# Patient Record
Sex: Female | Born: 1935 | Race: White | Hispanic: No | Marital: Married
Health system: Southern US, Community
[De-identification: ages and names within clinical notes are randomized; demographics above are authoritative.]

---

## 2008-01-19 ENCOUNTER — Ambulatory Visit: Payer: Self-pay | Admitting: Family Medicine

## 2008-02-19 ENCOUNTER — Ambulatory Visit: Payer: Self-pay | Admitting: General Surgery

## 2008-02-19 ENCOUNTER — Other Ambulatory Visit: Payer: Self-pay

## 2008-02-26 ENCOUNTER — Ambulatory Visit: Payer: Self-pay | Admitting: General Surgery

## 2008-03-12 ENCOUNTER — Other Ambulatory Visit: Payer: Self-pay

## 2008-03-12 ENCOUNTER — Inpatient Hospital Stay: Payer: Self-pay | Admitting: Surgery

## 2008-03-13 ENCOUNTER — Other Ambulatory Visit: Payer: Self-pay

## 2011-06-25 ENCOUNTER — Ambulatory Visit: Payer: Self-pay | Admitting: Family Medicine

## 2011-11-18 ENCOUNTER — Ambulatory Visit: Payer: Self-pay | Admitting: Family Medicine

## 2012-05-06 ENCOUNTER — Inpatient Hospital Stay: Payer: Self-pay | Admitting: Internal Medicine

## 2012-05-06 LAB — COMPREHENSIVE METABOLIC PANEL
Alkaline Phosphatase: 92 U/L (ref 50–136)
Anion Gap: 9 (ref 7–16)
Bilirubin,Total: 0.4 mg/dL (ref 0.2–1.0)
Calcium, Total: 9 mg/dL (ref 8.5–10.1)
Chloride: 100 mmol/L (ref 98–107)
Co2: 28 mmol/L (ref 21–32)
Creatinine: 0.65 mg/dL (ref 0.60–1.30)
EGFR (African American): >60
EGFR (Non-African Amer.): >60
Osmolality: 280 (ref 275–301)
Potassium: 4 mmol/L (ref 3.5–5.1)
SGOT(AST): 68 U/L — ABNORMAL HIGH (ref 15–37)
SGPT (ALT): 23 U/L (ref 12–78)
Sodium: 137 mmol/L (ref 136–145)

## 2012-05-06 LAB — CBC WITH DIFFERENTIAL/PLATELET
Eosinophil %: 0 %
HCT: 40.7 % (ref 35.0–47.0)
HGB: 13.8 g/dL (ref 12.0–16.0)
Lymphocyte #: 0.5 10*3/uL — ABNORMAL LOW (ref 1.0–3.6)
MCH: 33.9 pg (ref 26.0–34.0)
MCV: 100 fL (ref 80–100)
Monocyte %: 5.4 %
Neutrophil #: 10.1 10*3/uL — ABNORMAL HIGH (ref 1.4–6.5)
RBC: 4.07 10*6/uL (ref 3.80–5.20)
RDW: 12.7 % (ref 11.5–14.5)
WBC: 11.2 10*3/uL — ABNORMAL HIGH (ref 3.6–11.0)

## 2012-05-06 LAB — CK: CK, Total: 221 U/L — ABNORMAL HIGH (ref 21–215)

## 2012-05-06 LAB — URINALYSIS, COMPLETE
Bacteria: NONE SEEN
Leukocyte Esterase: NEGATIVE
Ph: 7 (ref 4.5–8.0)
Protein: NEGATIVE
Squamous Epithelial: <1

## 2012-05-07 DIAGNOSIS — I6789 Other cerebrovascular disease: Secondary | ICD-10-CM

## 2012-05-07 LAB — BASIC METABOLIC PANEL
Anion Gap: 10 (ref 7–16)
Calcium, Total: 8.7 mg/dL (ref 8.5–10.1)
Chloride: 100 mmol/L (ref 98–107)
Co2: 28 mmol/L (ref 21–32)
Osmolality: 277 (ref 275–301)

## 2012-05-07 LAB — HEMOGLOBIN A1C: Hemoglobin A1C: 6.3 % (ref 4.2–6.3)

## 2012-05-07 LAB — LIPID PANEL: Cholesterol: 216 mg/dL — ABNORMAL HIGH (ref 0–200)

## 2012-05-07 LAB — CBC WITH DIFFERENTIAL/PLATELET
Basophil %: 0.2 %
Eosinophil #: 0 10*3/uL (ref 0.0–0.7)
Eosinophil %: 0 %
HGB: 12.9 g/dL (ref 12.0–16.0)
Lymphocyte #: 1.4 10*3/uL (ref 1.0–3.6)
MCH: 35.2 pg — ABNORMAL HIGH (ref 26.0–34.0)
MCHC: 35.2 g/dL (ref 32.0–36.0)
MCV: 100 fL (ref 80–100)
Monocyte #: 0.7 x10 3/mm (ref 0.2–0.9)
Neutrophil #: 6.5 10*3/uL (ref 1.4–6.5)
Neutrophil %: 75.4 %
RBC: 3.66 10*6/uL — ABNORMAL LOW (ref 3.80–5.20)
WBC: 8.7 10*3/uL (ref 3.6–11.0)

## 2012-05-08 LAB — URINE CULTURE

## 2012-05-24 ENCOUNTER — Inpatient Hospital Stay: Payer: Self-pay | Admitting: Orthopedic Surgery

## 2012-05-24 ENCOUNTER — Ambulatory Visit: Payer: Self-pay | Admitting: Orthopedic Surgery

## 2012-05-24 LAB — CBC
HCT: 38.8 %
HGB: 13.2 g/dL
MCH: 34 pg
MCHC: 34 g/dL
MCV: 100 fL
Platelet: 511 x10 3/mm 3 — ABNORMAL HIGH
RBC: 3.87 X10 6/mm 3
RDW: 12.8 %
WBC: 5 x10 3/mm 3

## 2012-05-24 LAB — COMPREHENSIVE METABOLIC PANEL
Alkaline Phosphatase: 123 U/L (ref 50–136)
Anion Gap: 5 — ABNORMAL LOW (ref 7–16)
BUN: 9 mg/dL (ref 7–18)
Bilirubin,Total: 0.2 mg/dL (ref 0.2–1.0)
Chloride: 102 mmol/L (ref 98–107)
Co2: 31 mmol/L (ref 21–32)
Creatinine: 0.5 mg/dL — ABNORMAL LOW (ref 0.60–1.30)
EGFR (African American): >60
EGFR (Non-African Amer.): >60
Osmolality: 278 (ref 275–301)
Sodium: 138 mmol/L (ref 136–145)
Total Protein: 7.5 g/dL (ref 6.4–8.2)

## 2012-05-24 LAB — PROTIME-INR
INR: 0.9
Prothrombin Time: 12.7 secs (ref 11.5–14.7)

## 2012-05-24 LAB — TROPONIN I: Troponin-I: <0.02 ng/mL

## 2012-05-25 LAB — CBC WITH DIFFERENTIAL/PLATELET
Basophil #: 0 10*3/uL (ref 0.0–0.1)
Eosinophil #: 0 10*3/uL (ref 0.0–0.7)
Eosinophil %: 0.3 %
Lymphocyte #: 1.6 10*3/uL (ref 1.0–3.6)
MCH: 33.9 pg (ref 26.0–34.0)
MCHC: 33.9 g/dL (ref 32.0–36.0)
MCV: 100 fL (ref 80–100)
Monocyte #: 0.7 x10 3/mm (ref 0.2–0.9)
Neutrophil #: 4.8 10*3/uL (ref 1.4–6.5)
Neutrophil %: 67.7 %
Platelet: 408 10*3/uL (ref 150–440)
RBC: 3.08 10*6/uL — ABNORMAL LOW (ref 3.80–5.20)
RDW: 12.8 % (ref 11.5–14.5)

## 2012-05-25 LAB — BASIC METABOLIC PANEL
BUN: 10 mg/dL (ref 7–18)
Calcium, Total: 8.5 mg/dL (ref 8.5–10.1)
Chloride: 101 mmol/L (ref 98–107)
EGFR (Non-African Amer.): >60
Glucose: 200 mg/dL — ABNORMAL HIGH (ref 65–99)
Osmolality: 277 (ref 275–301)
Potassium: 3.6 mmol/L (ref 3.5–5.1)
Sodium: 136 mmol/L (ref 136–145)

## 2012-05-26 LAB — HEMOGLOBIN: HGB: 9.9 g/dL — ABNORMAL LOW (ref 12.0–16.0)

## 2012-07-15 ENCOUNTER — Inpatient Hospital Stay: Payer: Self-pay

## 2012-07-15 LAB — CBC WITH DIFFERENTIAL/PLATELET
Basophil #: 0 10*3/uL (ref 0.0–0.1)
Basophil %: 0.5 %
Eosinophil #: 0 10*3/uL (ref 0.0–0.7)
Eosinophil %: 0.1 %
HGB: 12.2 g/dL (ref 12.0–16.0)
Lymphocyte %: 24 %
MCH: 31.8 pg (ref 26.0–34.0)
Monocyte #: 0.7 x10 3/mm (ref 0.2–0.9)
Neutrophil %: 67 %
Platelet: 273 10*3/uL (ref 150–440)
RDW: 14.3 % (ref 11.5–14.5)

## 2012-07-15 LAB — COMPREHENSIVE METABOLIC PANEL
Bilirubin,Total: 0.3 mg/dL (ref 0.2–1.0)
Calcium, Total: 8.6 mg/dL (ref 8.5–10.1)
Chloride: 103 mmol/L (ref 98–107)
Creatinine: 0.5 mg/dL — ABNORMAL LOW (ref 0.60–1.30)
Glucose: 172 mg/dL — ABNORMAL HIGH (ref 65–99)
Osmolality: 280 (ref 275–301)
Total Protein: 6.9 g/dL (ref 6.4–8.2)

## 2012-07-15 LAB — URINALYSIS, COMPLETE
Bacteria: NONE SEEN
Glucose,UR: NEGATIVE mg/dL (ref 0–75)
Ketone: NEGATIVE
Leukocyte Esterase: NEGATIVE
Ph: 9 (ref 4.5–8.0)
Protein: NEGATIVE
RBC,UR: 2 /HPF (ref 0–5)
Specific Gravity: 1.013 (ref 1.003–1.030)
WBC UR: 3 /HPF (ref 0–5)

## 2012-07-15 LAB — PROTIME-INR: Prothrombin Time: 12.3 secs (ref 11.5–14.7)

## 2012-07-17 LAB — BASIC METABOLIC PANEL
BUN: 9 mg/dL (ref 7–18)
Calcium, Total: 7.8 mg/dL — ABNORMAL LOW (ref 8.5–10.1)
Co2: 26 mmol/L (ref 21–32)
EGFR (African American): >60
EGFR (Non-African Amer.): >60
Osmolality: 276 (ref 275–301)
Potassium: 3.5 mmol/L (ref 3.5–5.1)
Sodium: 137 mmol/L (ref 136–145)

## 2012-07-17 LAB — URINALYSIS, COMPLETE
Bilirubin,UR: NEGATIVE
Glucose,UR: >=500 mg/dL
Hyaline Cast: 1
Ketone: NEGATIVE
Nitrite: NEGATIVE
Ph: 5
Protein: 30
RBC,UR: 38 /HPF
Specific Gravity: 1.03
Squamous Epithelial: 1
WBC UR: 56 /HPF

## 2012-07-17 LAB — HEMOGLOBIN: HGB: 10.1 g/dL — ABNORMAL LOW (ref 12.0–16.0)

## 2012-07-17 LAB — PLATELET COUNT: Platelet: 181 10*3/uL (ref 150–440)

## 2012-07-17 LAB — URINE CULTURE

## 2012-07-18 LAB — HEMOGLOBIN: HGB: 8.7 g/dL — ABNORMAL LOW (ref 12.0–16.0)

## 2012-07-19 LAB — HEMOGLOBIN: HGB: 8.6 g/dL — ABNORMAL LOW (ref 12.0–16.0)

## 2013-05-11 IMAGING — CR DG C-ARM 1-60 MIN
1 series · 4 of 4 positions shown · non-contrast
Comparison: none

REASON FOR EXAM: left hip lo ng gamma nailing
COMMENTS:

[Series 6001: (person_name) · 4 of 4 slices shown]
[im 1/4]
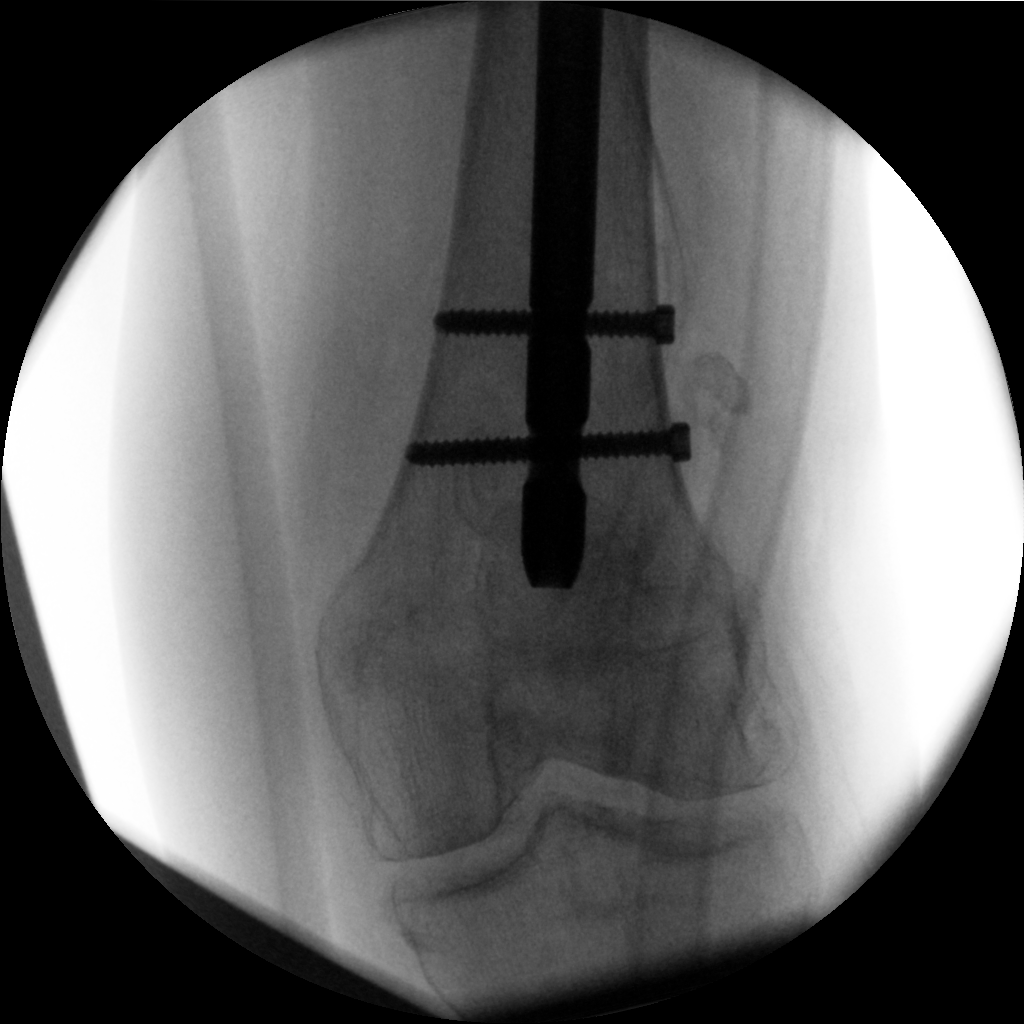
[im 2/4]
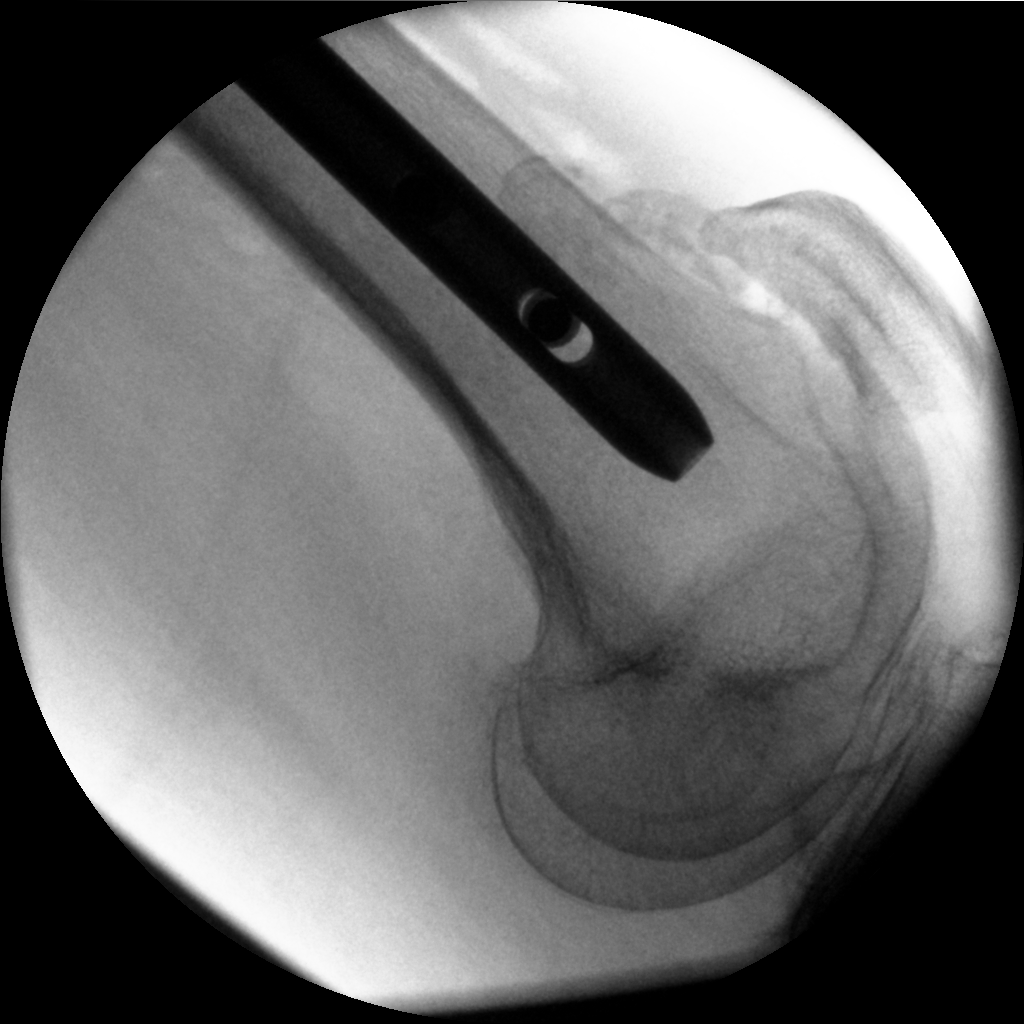
[im 3/4]
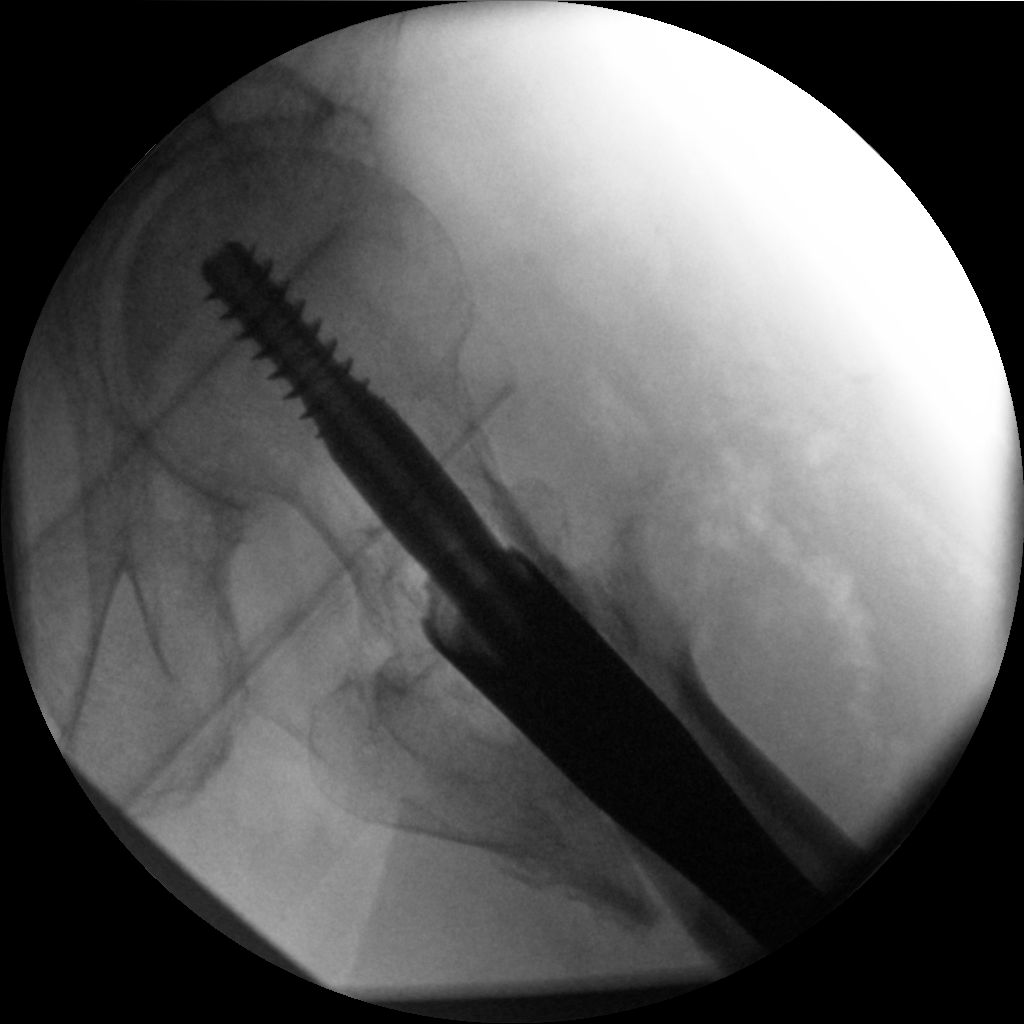
[im 4/4]
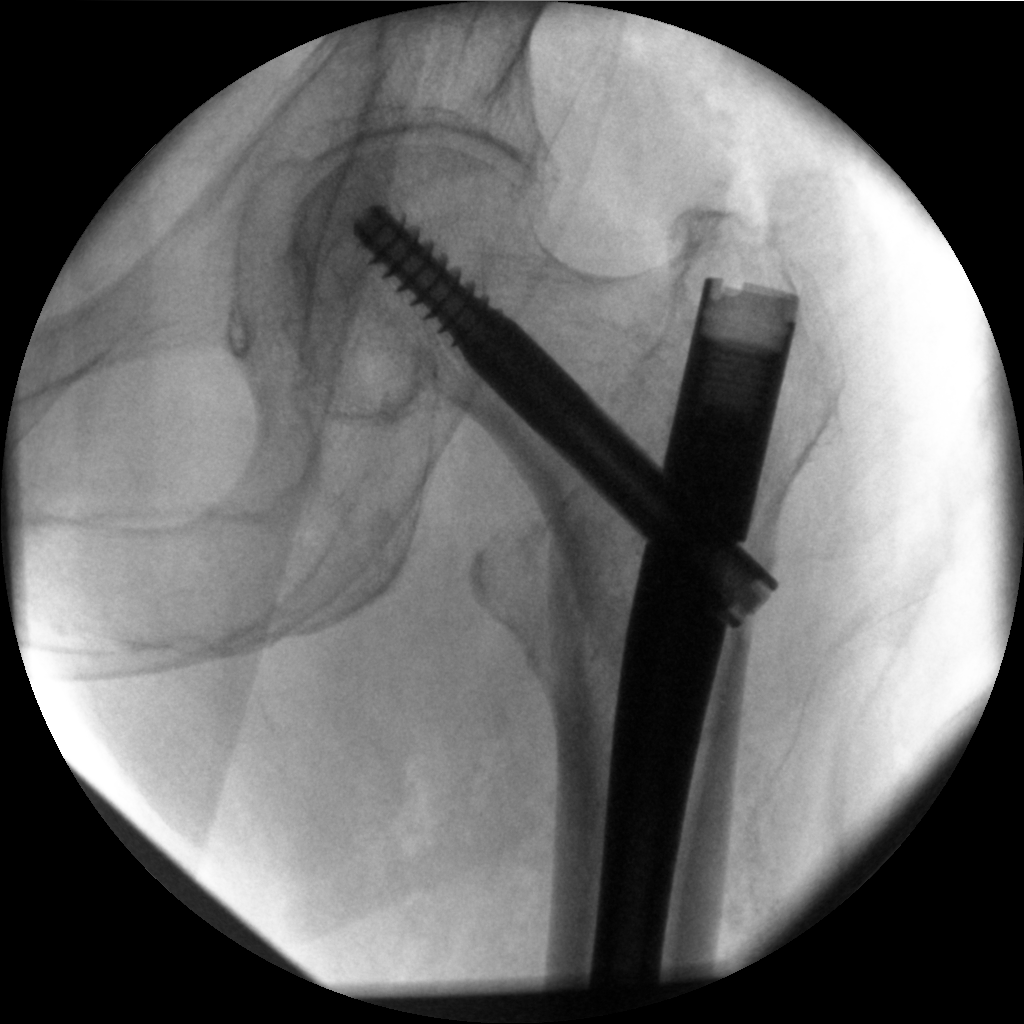

[4 of 4 positions shown; findings below may reference images not displayed]

PROCEDURE:     DXR - DXR C-ARM WITH 2 VIEWS LT HIP  - May 24, 2012  [DATE]

RESULT:     Findings: 5 views of the left femur demonstrates interval
placement of a left femoral intramedullary nail with an interlocking femoral
neck screw transfixing a comminuted intertrochanteric fracture which is in
near anatomic alignment.
IMPRESSION: Please see above.

## 2014-03-30 ENCOUNTER — Emergency Department: Payer: Self-pay | Admitting: Emergency Medicine

## 2014-10-25 NOTE — H&P (Signed)
PATIENT NAME:  Sharon Barker, Sila MR#:  [id number] DATE OF BIRTH:  03/23/1928  DATE OF ADMISSION:  05/06/2012  PRIMARY CARE PHYSICIAN: Dr. Dossie Arbourrissman  ED REFERRING PHYSICIAN: Dr. Juliette AlcideMelinda  CHIEF COMPLAINT: Patient found on the floor with slurred speech.    HISTORY OF PRESENT ILLNESS: Patient is an 81 year old white female with no significant medical history except history of diabetes who lives by herself and is usually in good health according to the family, was recently diagnosed with a urinary tract infection and was started on Cipro by her primary care provider. Patient was last seen by her family at around 8:30 p.m. When they went to check on our earlier today she was on the floor holding on to the side of the wall. She also had some emesis around her. She also had a left-sided facial droop which since being in the ED has improved according to her son. Her speech was very slurred initially, that has improved. Patient also is having left-sided weakness. She otherwise denies any fevers, chills. No chest pains or shortness of breath. No abdominal pain. Denies any other numbness. No visual difficulties.   PAST MEDICAL HISTORY: Diabetes.   PAST SURGICAL HISTORY:  1. Status post cholecystectomy. 2. Appendectomy.   ALLERGIES: None.   CURRENT MEDICATIONS:  1. Aspirin 81, 1 tab p.o. daily.  2. Cipro 500, 1 tab p.o. b.i.d.  3. Glipizide 2.5 daily.  4. Levemir 5 units at bedtime.  5. Actos 30 daily.   SOCIAL HISTORY: Does not smoke. Does not drink. No drugs. Lives by herself.   FAMILY HISTORY: Positive for hypertension.    REVIEW OF SYSTEMS: CONSTITUTIONAL: Denies any fevers, fatigue. Complains of mild weakness. No pain. No weight loss. No weight gain. EYES: No blurred or double vision. No pain. No redness. No inflammation. ENT: No tinnitus. No ear pain. No hearing loss. No seasonal or year-round allergies. No epistaxis. No difficulty swallowing. RESPIRATORY: Denies any cough, wheezing, or hemoptysis. No  chronic obstructive pulmonary disease. CARDIOVASCULAR: No chest pain. No orthopnea. No edema. No arrhythmia. GASTROINTESTINAL: No nausea, vomiting, diarrhea according to patient, however, as stated was found emesis around her. No abdominal pain. No hematemesis. No melena. GENITOURINARY: Denies any dysuria, hematuria, renal colic or frequency. ENDO: Denies any polydipsia, nocturia, or thyroid problems. HEME/LYMPH: No anemia, easy bruisability, bleeding. SKIN: No acne. No rash. No changes in mole, hair or skin. MUSCULOSKELETAL: Denies any pain in neck, back, or shoulder. NEURO: No numbness. No previous cerebrovascular accident or transient ischemic attack. No seizures. PSYCHIATRIC: No anxiety. No insomnia. No ADD.   PHYSICAL EXAMINATION:  VITAL SIGNS: Temperature 98, pulse 72, respirations 22, blood pressure 156/75.   GENERAL: Patient is an elderly white female currently not in any acute distress.   HEENT: Head atraumatic, normocephalic. Pupils equally round, reactive to light and accommodation. There is no conjunctival pallor. No scleral icterus. Nasal exam shows no drainage or ulceration. Oropharynx is clear without any exudates.   NECK: No thyromegaly. No carotid bruits.   CARDIOVASCULAR: Regular rate and rhythm. No murmurs, rubs, clicks, or gallops. PMI is not displaced.   LUNGS: Clear to auscultation.   ABDOMEN: Soft, nontender, nondistended. Positive bowel sounds x4. There is no hepatosplenomegaly.   EXTREMITIES: No clubbing, cyanosis, edema.   SKIN: No rash.   LYMPHATICS: No lymph nodes palpable.   VASCULAR: Good DP, PT pulses.   PSYCHIATRIC: Not anxious or depressed.   NEUROLOGICAL: Has a slight left-sided facial droop. Sensation intact in the face. Otherwise cranial nerves II  through XII grossly intact. Strength is 5/5 in right upper and right lower extremity. She has got diminished grip in the left upper extremity. Left lower extremity her strength is diminished between 4 and 5.  Babinski's downgoing. Reflexes 2+.   LABORATORY, DIAGNOSTIC AND RADIOLOGICAL DATA: Evaluation in the ED showed urinalysis that is 2+ blood, RBCs 26, WBCs 2. CT scan of the head shows chronic changes of microangiopathy and atrophy, small air-fluid levels in the left sphenoid maxillary sinus. WBC 11.2, hemoglobin 13.8, platelet count 215, glucose 219, BUN 11, creatinine 0.65, sodium 137, potassium 4.0, chloride 100, CO2 28. LFTs with a slightly elevated AST. Troponin less than 0.02. TSH 2.0. CPK 221. EKG normal sinus rhythm without any ST-T wave changes.   ASSESSMENT AND PLAN: Patient is an 81 year old white female found on the floor with slurred speech with difficulty walking.  1. Slurred speech, left-sided weakness, possible CVA. At this time she was already on aspirin so I will change it to Aggrenox twice a day. She will need an MRI of the brain. Will also get carotids, echo. Place her on telemetry. Will get PT, OT evaluation. Start her on cholesterol-lowering medication.  2. Diabetes. Will place her on sliding scale insulin. Continue glipizide, Levemir and Actos.  3. Elevated blood pressure. In light of her possible cerebrovascular accident will allow permissive hypertension.  4. Possible urinary tract infection. She was already on antibiotics at home. Will place her on IV ceftriaxone. Follow urine cultures.  5. Miscellaneous. Will place her on Lovenox for deep vein thrombosis prophylaxis.   TIME SPENT: 45 minutes spent. ____________________________ Lacie Scotts. Allena Katz, MD shp:cms D: 05/06/2012 12:22:12 ET T: 05/06/2012 12:32:33 ET  JOB#: [id number] cc: Jenaveve Fenstermaker H. Allena Katz, MD, <Dictator> Steele Sizer, MD Charise Carwin MD ELECTRONICALLY SIGNED 05/08/2012 17:36

## 2014-10-25 NOTE — Consult Note (Signed)
Brief Consult Note: Diagnosis: L hip prox femur fx (subtroch/intertroch).   Patient was seen by consultant.   Consult note dictated.   Recommend to proceed with surgery or procedure.   Orders entered.   Comments: full note dictated, hospitalist has seen pt plan: long cephomedullary nailf, possible need for open reduction d/w son risks/benefits/alternatives d/w pt's son.  Electronic Signatures: Kyra SearlesSloboda, Morningstar Toft F (MD)  (Signed [phone]-Nov-13 08:20)  Authored: Brief Consult Note   Last Updated: 17-Nov-13 08:20 by Kyra SearlesSloboda, Achaia Garlock F (MD)

## 2014-10-25 NOTE — H&P (Signed)
PATIENT NAME:  Sharon Barker, Sharon Barker MR#:  [id number] DATE OF BIRTH:  25-Jul-1927  DATE OF ADMISSION:  05/24/2012  CHIEF COMPLAINT: Left hip pain.  HISTORY OF PRESENT ILLNESS: Ms. Sharon Barker is an 81 year old female who sustained a mechanical fall this morning. She was brought to [facility] where she was diagnosed with a left proximal femur fracture for which I was asked to provide definitive consultation.   She complains of sharp intermittent pain in the left anterolateral hip region. She denied any antecedent hip pain. She is accompanied by her son today.   REVIEW OF SYSTEMS: NEUROLOGIC: Recent TIA with complete recovery per her son. This was approximately two weeks ago. CARDIAC: No chest pain. RESPIRATORY: No shortness of breath. SKIN: No complaints of laceration. GASTROINTESTINAL: No nausea or vomiting. MUSCULOSKELETAL: Complains of left hip pain. No history of antecedent hip pain. All other review of systems are negative.   PAST MEDICAL HISTORY: 1. Diabetes.  2. Hypertension. 3. History of TIA.  4. History of weakness. 5. Bilateral knee degenerative joint disease.  PAST SURGICAL HISTORY: 1. Cholecystectomy. 2. Appendectomy.   CURRENT ALLERGIES: No known drug allergies.   CURRENT MEDICATIONS: 1. Metoprolol 12.5 mg p.o. per day.  2. Levemir 5 units subcutaneous at bedtime. 3. NovoLog insulin. 4. Aspirin.  SOCIAL HISTORY: Nonsmoker, nondrinker. Currently lives with her son who is accompanying her today.   FAMILY HISTORY: Noncontributory for stroke or coronary artery disease.   PHYSICAL EXAMINATION: GENERAL: Pleasant, alert elderly female following commands.   VITAL SIGNS: Vital signs on presentation to the Emergency Department demonstrate pulse 82, blood pressure 158/77, 99% saturation on room air, temperature not obtained.   PSYCHIATRIC: Mood and affect appropriate.  LUNGS: Clear to auscultation bilaterally.  HEART: Regular rate, irregular rhythm. No murmurs.    ABDOMEN: Positive bowel sounds. Soft, nontender, nondistended.   LYMPHATIC: Mild to moderate swelling about the anterolateral aspect of the left hip.   NEUROLOGICAL: Motor and light touch sensation examination intact in the left lower extremity.   VASCULAR: 2+ dorsalis pedis and posterior tibialis pulse left lower extremity.   SKIN: Warm and intact over the left hip region.   HEENT: Normocephalic, atraumatic. Sclera clear. Oral mucosal membranes are slightly dry.   MUSCULOSKELETAL: Bilateral upper extremity examination and right lower extremity examination reveals there is no tenderness to palpation or deformity. Left lower extremity evaluation reveals that the left lower extremity is shortened and externally rotated. There is marked tenderness to palpation over the lateral and anterolateral aspect of the left hip with obvious deformity of the left lower extremity. The remainder of the ipsilateral left lower extremity is nontender to palpation.   LABORATORY, DIAGNOSTIC AND RADIOLOGICAL DATA: Laboratory evaluation on presentation to the Emergency Department demonstrates elevated glucose on the chemistries otherwise unremarkable chemistries except for slightly high AST at 72. Troponin is less than 0.02. CBC: Hemoglobin and hematocrit are 13.2 and 38.8 with platelets of 511,000.   Urinalysis is pending. PT-INR is pending.   Radiographs demonstrate a comminuted markedly displaced angulated left proximal femur subtrochanteric, intertrochanteric fracture three part.  IMPRESSION: Closed displaced left proximal femur fracture subtrochanteric, intertrochanteric pattern.   PLAN: Long intramedullary nail possible need for open reduction. Risks, benefits and alternatives were discussed with the patient to include but not limited to bleeding, infection, damage to blood vessels, need for other surgery and treatment, chronic pain, loss of function, stiffness, allergy anesthetic risk, increased risk of  infection and perioperative complications including malunion, nonunion, hardware cut out, infection and  stroke especially given recent history of TIA. Hospitalist has seen her and deemed her to be medically optimized for operative intervention today. Leg leg inequality was also discussed with the pt's son. We will proceed with surgery today. We will proceed with Buck's traction, TEDs and sequentials on the opposite leg and her son appeared to understand these risks and benefits.   ____________________________ Kyra Searles, MD jfs:cms D: 05/24/2012 08:20:22 ET T: 05/24/2012 08:49:46 ET JOB#: [id number]  cc: Kyra Searles, MD, <Dictator>  Kyra Searles MD ELECTRONICALLY SIGNED 05/24/2012 12:27

## 2014-10-25 NOTE — Consult Note (Signed)
PATIENT NAME:  Lonzo CloudKIDD, Shalece MR#:  [id number] DATE OF BIRTH:  Mar 01, 1928  DATE OF CONSULTATION:  05/24/2012  REFERRING PHYSICIAN:   CONSULTING PHYSICIAN:  Shaune PollackQing Steele Ledonne, MD  ADDENDUM:  The patient's EKG showed normal sinus rhythm at 88 beats per minute.    ____________________________ Shaune PollackQing Kadien Lineman, MD qc:bjt D: 05/24/2012 07:58:28 ET T: 05/24/2012 13:28:50 ET JOB#: [id number]  cc: Shaune PollackQing Imogean Ciampa, MD, <Dictator> Shaune PollackQING Raigan Baria MD ELECTRONICALLY SIGNED 05/27/2012 1:36

## 2014-10-25 NOTE — Discharge Summary (Signed)
PATIENT NAME:  Sharon Barker, Sharon Barker MR#:  [id number] DATE OF BIRTH:  May 27, 1928  DATE OF ADMISSION:  05/06/2012 DATE OF DISCHARGE:  05/09/2012  DISCHARGE DIAGNOSES:  1. Possible transient ischemic attack.  2. Elevated blood pressure. 3. Diabetes. 4. Weakness. 5. Severe degenerative joint disease with right knee pain.   DISPOSITION: The patient is being discharged to a rehab facility.   DIET: Low sodium, 1800 calorie ADA diet.   ACTIVITY: As tolerated.   DISCHARGE MEDICATIONS: 1. Pioglitazone 30 mg daily.  2. Aspirin 81 mg daily.  3. Levemir 5 units subcutaneously at bedtime.  4. Glipizide 5 mg 1/2 tablet at supper. 5. Lipitor 20 mg daily.  6. Levaquin 500 mg daily. 7. Tylenol 650 mg every 4 hours p.r.n.  8. Advil 200 mg every 4 hours p.r.n.  9. Voltaren gel 1% twice a day to right knee.   DISCHARGE FOLLOWUP: Follow-up with PCP in 1 to 2 weeks after discharge.   CONSULTANTS: Sharon SorJames Hooten, MD - Orthopedics.  RESULTS: CT of the head shows no acute abnormalities.   Chest x-ray shows no acute abnormalities.   Right knee x-ray shows severe degenerative joint disease.   MRI of the brain shows no acute abnormalities.  Carotid ultrasound shows no hemodynamically significant stenosis.   Echo shows normal ejection fraction.   Microbiology: Urine culture: Mixed bacterial organisms.   White count 11.2, hemoglobin 13.8, hematocrit 40.7, and platelet count 215. Glucose 229, BUN 10, creatinine 0.65, sodium 134, and potassium 4. VLDL 13, LDL 121, cholesterol 216, and HDL 82. Hemoglobin A1c 6.3. TSH normal. Cardiac enzymes normal. LFTs normal.   HOSPITAL COURSE: The patient is an 81 year old female with history of diabetes and possible underlying dementia who had been having difficulty walking for two days. She was brought into the hospital with slurred speech and difficulty walking. She was admitted with the diagnosis of possible CVA/transient ischemic attack. Extensive work-up including CT of  the head, MRI of the brain, carotid ultrasound, and echocardiogram were essentially negative. She is currently on anticoagulation therapy. Her Accu-Cheks were controlled. Her blood pressure was elevated on presentation but is better controlled off any medications currently. Her urinalysis showed possible UTI; however, her cultures showed mixed bacterial organisms. She had difficulty walking and rehab was suggested. As per the patient's daughter-in-law, she had been having difficulty walking for the last two years. The patient complained of pain and swelling in her right knee and x-ray showed severe degenerative joint disease. An orthopedic consultation with Dr. Ernest Barker was obtained who recommended just conservative management and local Voltaren gel. The patient is being discharged to a rehab facility in stable condition.   TIME SPENT: 45 minutes.  ____________________________ Sharon MeigsSangeeta Jecenia Leamer, MD sp:slb D: 05/09/2012 09:27:22 ET     T: 05/09/2012 10:16:48 ET     JOB#: [id number] cc: Sharon MeigsSangeeta Jaedon Siler, MD, <Dictator> Sharon MeigsSANGEETA Alajiah Dutkiewicz MD ELECTRONICALLY SIGNED 05/09/2012 13:25

## 2014-10-25 NOTE — Consult Note (Signed)
Brief Consult Note: Diagnosis: DJD right knee.   Comments: Patient with long history of right knee arthritis as per daughter-in-law. Mobility has gradually decreased. Not using any ambulatory aids at home. Radiographs demonstrate tricompartmental degenerative changes with relative valgus alignment, subchondral sclerosis, and osteophyte formation. Pateint is asleep at this time. Will return tomorrow to fully examine the right knee.  Electronic Signatures: Donato HeinzHooten, James P (MD)  (Signed [phone]-Nov-13 21:31)  Authored: Brief Consult Note   Last Updated: 01-Nov-13 21:31 by Donato HeinzHooten, James P (MD)

## 2014-10-25 NOTE — Op Note (Signed)
PATIENT NAME:  ANTONETTE, HENDRICKS MR#:  [id number] DATE OF BIRTH:  06-25-1928  DATE OF PROCEDURE:  05/24/2012  PREOPERATIVE DIAGNOSIS: Closed displaced left proximal intertrochanteric/subtrochanteric fracture.  POSTOPERATIVE DIAGNOSIS: Closed displaced left proximal intertrochanteric/subtrochanteric fracture.  PROCEDURE:  Placement of cephalomedullary nail, left proximal femur fracture.   SURGEON: Annamary Rummage, MD  ASSISTANT: None.  COMPLICATIONS: None apparent.  ESTIMATED BLOOD LOSS: 200 mL.   ANESTHESIA: Spinal with IV sedation.  OPERATIVE FINDINGS: Reducible mildly comminuted left proximal femur subtrochanteric/intertrochanteric fracture. Postoperative examination showing 2+ dorsalis pedis and posterior tibial as well as posterior tibialis pulse.  IMPLANTS: Stryker gamma 3 nail 11 x 125 degrees x 400 mm, 10.5 x 90 mm lag screw, interlocking screws 5.0 mm x 2 set screw.   INDICATIONS: Ms. Val is an 81 year old female who sustained a fall this morning resulting in displaced subtrochanteric/intertrochanteric fracture indicating need for operative treatment. The risks, benefits, and alteratives were discussed with her and her son to include but not limited to bleeding, infection, and damage to blood vessels and nerves with need for further surgery and treatment, chronic pain, loss of function, stiffness, anesthetic risk, DVT, PE, heart, lung, brain, and kidney complications, increased risk of perioperative complications including stroke, MI, infection, and nonunion given history of diabetes. She and her son appeared to understand the risks and benefits and desired to proceed with operative treatment. A preoperative hospitalist consultation deemed that the patient was medically optimized for surgical intervention.  DESCRIPTION OF PROCEDURE: After positive identification, with the patient in the preoperative holding area, and after informed consent had been obtained, after the correct procedural site  had been signed by myself,  the patient was taken to the operating room and placed in the supine position. Spinal anesthesia was administered.  IV antibiotics were given before skin incision having been made. Time out was performed confirming the correct procedure, site, and patient prior to proceeding with the procedure. The patient was transferred to the fracture table, brought down against the perineal post. Traction and, internal rotation were applied to the left lower extremity. The well leg was flexed in the well-leg holder. Fluoroscopic unit was brought in showing near anatomic reduction.  The fluoroscopic unit was then backed out after adequate imaging and acceptable reduction had been obtained. A bump was placed under the left hip. The left lower extremity was padded and left arm was brought across the body. The right hip and thigh were prepped and draped in usual sterile fashion.  A longitudinal incision was made coursing proximal to the greater trochanter, one at the femoral shaft. The incision was carried sharply through skin and subcutaneous tissues. Tensor facia lata fascia was incised in line with its fibers. At this point, dissection was carried out down to the level of the greater trochanter. Curved awl was placed on the anterior third, posterior two-thirds junction. It was advanced under fluoroscopic guidance. A guidewire was used through the cannulated awl and the one-step reamer was then used to the level of lesser trochanter. The ball-tip guidewire which had been bent approximately 2 centimeters proximal to the ball-tip was then bent and advanced in a center-center position under fluoroscopic guidance, on AP and lateral. Wire depth was accessed at 430 mm.  Therefore, I opted to place a 400 mm nail. Preoperative templating showed 125 degree neck shaft angle, both measured and confirmed with template. Successive reaming was then carried out from 11 to 12 to 13 mm, in 1 mm increments, and the 11 x  400 mm  x 125 mm gamma nail was then inserted to the appropriate depth and using carbon fiber aiming guide, the  lag screw guide was then brought to the lateral aspect of the femur after a 1.5 cm incision had been made over the lateral femoral skin.  The guidewire was driven low in the femoral neck in and in center-center position on the lateral. Guidewire depth was accessed at 88 mm and I opted to place a 90 mm lag screw. There was no femoral head penetration at any time with the lag screw or the drill. The drill was used to over-ream the guidewire and the 90 x 10.5 mm lag screw was then placed and set screw was placed as well, after the fracture had been compressed using the compression device through the jig.  The jig was removed. Appropriate position and depth of the femoral lag screw was confirmed under biplanar fluoroscopy. Perfect  circle  technique was carried out distally. A small incision was made over the lateral aspect of the femur x2. Blunt dissection was carried out down to the level of the femur. Two interlocking screws were placed in the distal aspect of the nail, confirmed under medullary position under biplanar fluoroscopy. All areas were copiously irrigated. The distal three wounds were closed with 3-0 nylon in a figure-of-eight fashion. The deep fascia was closed proximally with 0 PDS. The skin was then closed in layers with 3-0 nylon in the skin and 20 mL of 0.25% plain Marcaine injected proximal to the incisions. Aquacel dressing was placed proximally, 2 x 2 tape dressings were placed distally. The patient was taken out of the traction apparatus, transferred to the recovery room bed where equal leg lengths were noted and 2+ dorsalis pedis and posterior tibial pulse. These findings were then relayed to her son who appeared to understand the nature of the operating as well as her need for followup compliance, hospital stay, and possible rehabilitation stay as well.   ____________________________ Kyra SearlesJohn F. Sarabella Caprio, MD jfs:slb D: 05/24/2012 17:19:04 ET T: 05/25/2012 09:11:18 ET JOB#: [id number]  cc: Kyra SearlesJohn F. Jamarquis Crull, MD, <Dictator> Kyra SearlesJOHN F Lonia Roane MD ELECTRONICALLY SIGNED 06/13/2012 13:24

## 2014-10-25 NOTE — Consult Note (Signed)
PATIENT NAME:  Sharon Barker, Sharon Barker MR#:  [id number] DATE OF BIRTH:  05-19-1928  DATE OF CONSULTATION:  05/24/2012  REFERRING PHYSICIAN:  Orthopedic surgeon, Dr. Servando SnareWohl  CONSULTING PHYSICIAN:  Shaune PollackQing Addiel Mccardle, MD  PRIMARY CARE PHYSICIAN: Dr. Vonita MossMark Crissman   REASON FOR CONSULTATION: Preop evaluation.   REVIEW OF HISTORY: Patient is an 81 year old Caucasian female with history of possible transient ischemic attack, diabetes, weakness, severe degenerative joint disease with right knee presented to ED with fall this morning. Patient is alert, awake, oriented in no acute distress. According to her and her son, patient was just discharged about two weeks ago for weakness and possible transient ischemic attack. CVA work-up was negative. She was discharged to rehab and discharged to home recently. She slipped using walker early this morning and fell on the left hip with hip pain. X-ray showed left hip fracture. Orthopedic surgeon requests preop evaluation. Patient denies any syncope, loss of consciousness or seizure. No incontinence. No dysuria, hematuria. Patient also denies any headache or dizziness. According to her son patient recovered very well after rehab, has a good appetite.   PAST MEDICAL HISTORY: 1. Diabetes. 2. Possible transient ischemic attack. 3. Elevated blood pressure. 4. Weakness. 5. Severe degenerative joint disease with right knee.   PAST SURGICAL HISTORY:  1. Cholecystectomy.  2. Appendectomy.   SOCIAL HISTORY: No smoking, alcohol drinking or illicit drugs.   FAMILY HISTORY: Hypertension.   ALLERGIES: No.   MEDICATIONS:  1. Pioglitazone 30 mg p.o. daily.  2. Levaquin 500 mg p.o. daily for urinary tract infection.  3. Levemir 5 units sub-Q at bedtime.  4. Glipizide 5 mg 0.5 tablet once a day at supper.  5. Aspirin 81 mg p.o. daily.  6. Actos p.o. daily.  7. Tylenol 2 tablets every four hours p.r.n.    REVIEW OF SYSTEMS: CONSTITUTIONAL: Patient denies any fever, chills. No headache or  dizziness. No weakness or weight loss. EYES: No double vision, blurred vision. ENT: No epistaxis, postnasal drip, or slurred speech. CARDIOVASCULAR: No chest pain, palpitation, orthopnea, or nocturnal dyspnea or leg edema. PULMONARY: No cough, sputum, shortness of breath, or hemoptysis. GASTROINTESTINAL: No abdominal pain, nausea, vomiting, or diarrhea. No melena or bloody stool. GENITOURINARY: No dysuria, hematuria, or incontinence. SKIN: No rash or jaundice. HEMATOLOGY: No easy bruising, bleeding. MUSCULOSKELETAL: No edema but has left hip pain. NEUROLOGY: No syncope, loss of consciousness or seizure.   PHYSICAL EXAMINATION:  VITALS: Temperature 98, blood pressure 140/72, pulse 83, respirations 18, oxygen saturation 97 on room air.   GENERAL: Patient is alert, awake, oriented, in no acute distress.   HEENT: Pupils round, equal, reactive to light and accommodation. Moist oral mucosa. Clear oropharynx.   NECK: Supple. No JVD or carotid bruits. No lymphadenopathy. No thyromegaly.   CARDIOVASCULAR: S1, S2 regular rate, rhythm. No murmurs, gallops.   PULMONARY: Bilateral air entry. No wheezing, rales. No use of accessory muscles to breathe.   ABDOMEN: Soft. No distention. No tenderness. No organomegaly. Bowel sounds present.   EXTREMITIES: No edema, clubbing, or cyanosis. No calf tenderness. Strong bilateral pedal pulses. Left leg is shorter than the right leg. There is a deformity.   NEUROLOGY: Alert and oriented x3. No focal deficit.   LABORATORY, RADIOLOGICAL AND DIAGNOSTIC DATA: Glucose 172, BUN 9, creatinine 0.5. Electrolytes normal. WBC 5, hemoglobin 13.2, platelets 511. Troponin less than 0.02. X-ray showed left hip fracture.   IMPRESSION:  1. Left hip fracture.  2. Diabetes.  3. Thrombocytosis, acute reaction.  4. Severe degenerative joint disease with  right knee.  5. Mild elevated blood pressure, possible hypertension.   RECOMMENDATIONS:  1. Patient has moderate risk for hip  surgery. We will hold aspirin and give low dose Lopressor before surgery.  2. For diabetes we will hold p.o. diabetes medications and start sliding scale and continue Levemir 5 units sub-Q at bedtime.  3. Deep vein thrombosis prophylaxis after surgery.   Discussed the patient's situation and plan of treatment plan with patient and patient's son.   TIME SPENT: About 50 minutes.  ____________________________ Shaune Pollack, MD qc:cms D: 05/24/2012 07:57:10 ET T: 05/24/2012 13:05:09 ET JOB#: [id number]  cc: Shaune Pollack, MD, <Dictator> Steele Sizer, MD Shaune Pollack MD ELECTRONICALLY SIGNED 05/27/2012 1:36

## 2014-10-25 NOTE — Consult Note (Signed)
PATIENT NAME:  Sharon Barker, Sharon Barker MR#:  [id number] DATE OF BIRTH:  1927/09/07  DATE OF CONSULTATION:  05/08/2012  REFERRING PHYSICIAN:  Auburn BilberryShreyang Patel, MD CONSULTING PHYSICIAN:  Illene LabradorJames P. Angie FavaHooten Jr., MD  CHIEF COMPLAINT: Right knee pain.   HISTORY OF PRESENT ILLNESS: The patient is an 81 year old female who was apparently found by family on the floor of her home on the day of admission. She apparently had left-sided facial droop as well as some slurred speech and left-sided weakness. The symptoms were noted to be resolving while in the emergency department. On speaking with the patient's daughter-in-law, it was felt that she may have slid down from a seat onto the floor. The patient does not recall the events. She has apparently had a several year history of progressive right knee pain and occasionally uses ambulatory aids. She has apparently become more sedentary and has done minimal ambulation about the home. With attempts at ambulation with physical therapy during this hospitalization, she has resisted walking due to complaints of pain in the right knee.   PAST MEDICAL HISTORY:  1. Diabetes.  2. Status post cholecystectomy.  3. Status post appendectomy.   ALLERGIES: No known drug allergies.   ADMISSION MEDICATIONS:  1. Aspirin 81 mg daily.  2. Cipro 500 mg twice a day. 3. Glipizide 2.5 mg daily.  4. Levemir 5 units at bedtime. 5. Actos 30 mg p.o. daily.   SOCIAL HISTORY: The patient has been living by herself with family checking in on her on a daily basis. No tobacco or alcohol use. No illicit drugs.   FAMILY HISTORY: Positive for hypertension.   REVIEW OF SYSTEMS: Review of systems was difficult to elicit from the patient. She did report right knee pain as well as some generalized joint stiffness. She denied any gross numbness. She did report some generalized weakness.   PHYSICAL EXAMINATION:   GENERAL: The patient is an elderly, thin female seen supine in bed, in no acute distress.    HEENT: Atraumatic, normocephalic. Oropharynx is clear. No apparent bruising.   NECK: Supple and nontender with good range of motion.   CARDIAC: Regular rate and rhythm with normal S1 and S2. No appreciable murmurs, gallops, or rubs.   PULMONARY: Lungs are clear to auscultation bilaterally.   MUSCULOSKELETAL: The patient demonstrates reasonably good range of motion and strength to the upper extremities. No gross tenderness to palpation. Examination of lower extremities is pertinent for findings involving the right knee. A moderate effusion is noted. No erythema or ecchymosis about the knee. No evidence of abrasions. The patella is ballotable. Relative valgus alignment is noted. The knee is stable to varus and valgus stress. Crepitus is noted with attempted range of motion. The patient demonstrates guarding with range of motion. She demonstrates flexion to approximately 75 degrees before guarding prevents any additional range of motion. She reports tenderness to palpation about both the medial and lateral joint lines. Lachman's test is negative. No palpable defect to the quadriceps mechanism. Some generalized atrophy is noted to the quadriceps.   NEUROLOGIC: The patient is awake and alert. She is somewhat confused. Sensory function is grossly intact in all extremities. Motor strength is felt to be within normal limits for age.   RADIOGRAPHS: Multiple views of the right knee [facility] were reviewed and were compared with previous films. No significant interval change. Degenerative changes are noted with osteophyte formation as well as subchondral sclerosis.   IMPRESSION: Degenerative arthrosis of the right knee.   PLAN:  I attempted to discuss the findings with the patient. I would recommend ice or cold therapy to decrease the swelling. She may also benefit from topical nonsteroidal antiinflammatory medication such as Voltaren gel applied three times a day. I would avoid  oral nonsteroidal antiinflammatory medications due to anticoagulation which was initiated during this hospitalization. I will also defer aspiration of the knee given the fact that she is anticoagulated as well as at this time she is alone and I do not feel that appropriate informed consent can be obtained. I would continue with physical therapy for mobilization. She may return to the clinic in two to four weeks for reevaluation as needed.  ____________________________ Illene Labrador. Angie Fava., MD jph:slb D: 05/10/2012 06:50:00 ET T: 05/10/2012 15:45:22 ET JOB#: [id number]  cc: Fayrene Fearing P. Angie Fava., MD, <Dictator> JAMES P Angie Fava MD ELECTRONICALLY SIGNED 05/12/2012 20:17

## 2014-10-25 NOTE — Consult Note (Signed)
Chief Complaint:   Subjective/Chief Complaint Right knee pain   Brief Assessment:   Cardiac Regular    Respiratory normal resp effort  clear BS    Additional Physical Exam Right knee: Tender to palpation along the medial & lateral joint lines. (+) effusion (+) crepitance Stable to varus/valgus stress Flexion limited to <90 degrees secondary to guarding  Neuro: Confused; unable to follow simple commands Sensory & motor function grossly intact   Assessment/Plan:  Assessment/Plan:   Assessment Osteoarthritis of the right knee    Plan Ice/cold therapy to the right knee to decrease swelling. Consider topical NSAID (Voltaren gel to right knee tid). Will defer aspiration at this time (due to anticoagulation and inability to obtain informed consent). Patient may follow-up in the office in 2-4 weeks or as needed. Will sign off.  Please call with questions.   Electronic Signatures: Donato HeinzHooten, James P (MD)  (Signed [id number]-Nov-13 09:38)  Authored: Chief Complaint, Brief Assessment, Assessment/Plan   Last Updated: 98-JXB-14: 02-Nov-13 09:38 by Donato HeinzHooten, James P (MD)

## 2014-10-25 NOTE — Discharge Summary (Signed)
PATIENT NAME:  Sharon Barker, Sharon Barker MR#:  [id number] DATE OF BIRTH:  1928/03/14  DATE OF ADMISSION:  05/24/2012 DATE OF DISCHARGE:  05/27/2012  OPERATION: On 05/24/2012, Dr. Annamary RummageJohn Sloboda performed open reduction and internal fixation of the left proximal femur subtrochanteric/intertrochanteric fracture with a long cephalomedullary nail.   SURGEON:JohnF.Sloboda,MD  ANESTHESIA: Spinal.   ESTIMATED BLOOD LOSS: 200 mL.  SPECIMEN:  None.  ADMITTING DIAGNOSIS: Displaced left proximal femur subtrochanteric/intertrochanteric fracture.   DISCHARGE DIAGNOSIS:  Displaced left proximal femur subtrochanteric/intertrochanteric fracture.    DRAINS: None.   COMPLICATIONS: None.   IMPLANTS: Stryker G3 nail, 11 x 400 x 125 degrees,  90 x 10.5- mm lag screw, and two 5-mm screws and sets screw.   The patient was stabilized and brought to the recovery room and then brought to the orthopedic floor where she was treated with physical therapy and pain control.   HISTORY: Sharon Barker is an 81 year old female who had a mechanical fall and was brought  to [facility] Emergency Department on the day of injury and was found to have a left proximal femur subtrochanteric/intertrochanteric fracture.  PHYSICAL EXAM: PSYCH: Mood and affect appropriate. LUNGS: Clear to auscultation bilaterally. HEART: Regular rate and rhythm. No murmurs. ABDOMEN: Positive bowel sounds, soft, nontender, nondistended. LYMPHATIC: Mild to moderate swelling at the anterior lateral left hip. NEUROLOGIC: Motor and light touch sensation intact left lower extremity. CARDIOVASCULAR: 2+ DP and PT pulses. SKIN: Warm and intact to the left hip. HEENT normocephalic, atraumatic. Sclerae clear. Oral mucosa slightly dry. MUSCULOSKELETAL: Left lower extremity is shortened and externally rotated. She has tenderness over the lateral anterior aspect of the left hip with obvious deformity of left lower extremity.   HOSPITAL COURSE: After admission on  05/24/2012 she underwent ORIF with cephalomedullary nail and tolerated surgery well. She had good pain control afterwards and was brought to the orthopedic floor from the PACU. Her initial hemoglobin after surgery was 10.4 and hematocrit 30.8. On postoperative day one, 05/25/2012, she was only oriented to self, which is baseline for her. She was afebrile and labs showed 10.4 hemoglobin and 30.8 hematocrit. She is to begin working with physical therapy. She has only been able to transfer from the bed to the chair. Today, 05/27/2012, she was doing well and stable and ready for transfer to rehab.   CONDITION AT DISCHARGE: Stable but oriented to self.   DISPOSITION:  The patient is sent to rehab.   DISCHARGE INSTRUCTIONS:  1. She will [facility] in 3 to 4 weeks.  2. Her sutures are to be removed by the facility in a week and a half.  3. She will do physical therapy with weight-bearing as tolerated on the left lower extremity. Dressing can be changed daily and on an as-needed basis.  4. Nursing is to continue to reorient.   DISCHARGE MEDICATIONS:  1. Acetaminophen 325 mg, 2 tabs p.o. q. 4 hours p.r.n. pain.  2. Levofloxacin 500 mg 1 tab p.o. q. 24 hours.  3. Pioglitazone 30 mg, 1 tab p.o. once daily.  4. Aspirin 81 mg, 1 tab p.o. once daily.  5. Levemir FlexPen 100 units per mL subcutaneous, 5 units subcutaneous once at bedtime.  6. Glipizide 5 mg 1 tab p.o. with supper.  7. Docusate/senna 50/8.6 mg, 1 tab p.o. b.i.d., p.r.n. constipation.     8. Lovenox 40 mg subcutaneous daily times two weeks.  9. Metoprolol 12.5 mg 1 tab p.o. b.i.d.      ____________________________ Amadea Keagy  Jodell Cipro, NP amb:bjt D: 05/27/2012 13:00:52 ET T: 05/27/2012 13:18:28 ET JOB#: [id number]  cc: Lolah Coghlan M. Haskel Khan, NP, <Dictator> Burt Ek Hubbert Landrigan FNP ELECTRONICALLY SIGNED 06/02/2012 15:44

## 2014-10-28 NOTE — Discharge Summary (Signed)
PATIENT NAME:  Sharon Barker, Sharon Barker MR#:  [id number] DATE OF BIRTH:  19-Sep-1927  DATE OF ADMISSION:  07/15/2012 DATE OF DISCHARGE:  07/20/2012  ADMITTING DIAGNOSES:  1. Right trochanteric hip fracture.  2. Right Colles' fracture.   DISCHARGE DIAGNOSES:  1. Right trochanteric hip fracture.  2. Right Colles' fracture. 3. Urinary tract infection.   PROCEDURE: ORIF of right wrist and right hip.   SURGEON: Leitha SchullerMichael J. Menz, M.D.   ANESTHESIA: General.   TOURNIQUET TIME: 33 minutes at 250 mmHg.   ESTIMATED BLOOD LOSS: 160 mL.  HISTORY: The patient is an 81 year old female who sustained a mechanical fall this morning and was unable to stand or bear weight on the right lower extremity. She also complained of right wrist pain and had an obvious deformity to the right wrist. Prior to the fall, she was ambulating with a walker with assistance and had been recovering from ORIF of the left hip performed in November 2013. She denied any loss of consciousness or any other injury.   PHYSICAL EXAMINATION:   GENERAL: Well developed, well-nourished, in no acute distress.   HEENT: Pupils equal, round and reactive light. Hearing intact to voice. Moist oral mucosa.   NECK: Supple.   RESPIRATORY: Normal respiratory effort, clear breath sounds.   CARDIOLOGY: Regular rate. No murmur. No thrills. No lower extremity edema. No JVD.   ABDOMEN: Denies tenderness. Soft. Normal bowel sounds.   GENITOURINARY: Foley catheter in place.   EXTREMITIES: Right lower extremity shortened and externally rotated. Pain is elicited with tenderness with range of motion of the hip. No gross tenderness palpation of the knee. No gross knee effusion. The right wrist demonstrates obvious deformity, early ecchymosis, swelling and tender to palpation of the distal radius.   NEUROLOGIC: Motor and sensory function intact.   PSYCH: Alert. Poor insight.   HOSPITAL COURSE: The patient was admitted to the hospital on 07/15/2012.  She had  surgery the following day, on 07/16/2012. After surgery the patient was brought down to the orthopedic floor from the PAC-U unit in stable condition. The patient's blood work and vital signs were monitored. She had acute blood loss anemia secondary to postop with hemoglobin down to 10.1. The following day hemoglobin dropped to 8.7 and on the 12th she had a small drop to 8.6. The patient was also treated for her diabetes, which was controlled throughout her stay. She was also treated for a urinary tract infection that was found on urine cultures. The patient was treated with IV antibiotics and by January 12th was put on Augmentin for another 3 days, p.o. The patient had slow progress with physical therapy throughout her stay. By 07/20/2012, the patient was medically stable, she had had a bowel movement, she had slow progress with physical therapy and she was ready to go back to the skilled nursing facility for continuation of occupational and physical therapy. She was sent home with p.o. Augmentin for another 3 days for UTI.   DISCHARGE INSTRUCTIONS: She may gradually increase weight-bearing on the affected extremity. She needs to wear thigh-high TED hose on both legs and remove at bedtime and replace on arising the next morning. Elevate the heels off the bed. Incentive spirometry every one hour while awake and encourage cough and deep breathing. She needs a platform attachment for her walker.   DIET: She may resume a regular diet as tolerated.   WOUND CARE: Apply an ice pack to the affected area. Do not get the dressing or bandage wet or dirty.  Call [facility]' office if the dressing gets water under it. Leave the dressing on.   SYMPTOMS TO REPORT: Call [facility] if any of the following occur: Bright red bleeding from the incision wound, fever above 101.5 degrees, redness, swelling or drainage at the incision. Call [facility] if you experience any increased  leg pain, numbness or weakness in the legs or bowel or bladder symptoms.   REFERRALS: She is referred to physical therapy and occupational therapy at SNF facility.  DISCHARGE FOLLOW-UP:  She has a follow-up appointment with [facility] in 2 weeks. She needs to call for an appointment. At this appointment, we will take x-rays and remove the sutures and staples.   DISCHARGE MEDICATIONS: 1. Glipizide 5 mg oral tablet 1/2 tab orally once a day at supper.  2. Levemir Flex-Pen 100 units subcutaneous solution, 7 units subcutaneous once a day at bedtime. 3. Vitamin B12 1 tablet orally once a day. 4. Actos 30 mg oral tablet 1 tablet orally once a day.  5. Tylenol 500 mg oral tablet 1 tablet orally every 4 hours as needed for pain or temp greater than 100.4.  6. Oxycodone 5 mg oral tablet 1 tablet orally every 4 hours as needed for pain.  7. Magnesium hydroxide 8% oral suspension 30 mL orally 2 times a day as needed for constipation. 8. Magnesium hydroxide 8% oral suspension 30 mL orally 2 times a day as needed for constipation.  9. Lovenox one injectable once a day x 14 days.  10. Bisacodyl 10 mg rectal suppository one suppository rectally once a day as needed for constipation.  11. Lactulose 10 grams in 15 mL syrup 30 mL orally 2 times a day as needed for constipation.  12. Docusate senna 50/8.6 mg oral tablet 1 tablet orally 2 times a day.        13. Augmentin 875/125 mg one orally 2 times a day for 3 days and then stop. 14. Metoprolol tartrate 25 mg oral tablet 1/2 tab orally 2 times a day.  ____________________________ Evon Slack, PA-C tcg:sb D: 07/20/2012 11:19:23 ET T: 07/20/2012 11:35:49 ET JOB#: [id number]  cc: Evon Slack, PA-C, <Dictator> Evon Slack [place] ELECTRONICALLY SIGNED 07/28/2012 15:43

## 2014-10-28 NOTE — H&P (Signed)
Subjective/Chief Complaint Right hip and wrist pain    History of Present Illness 81 year old female sustained a mechanical fall this morning and was unable to stand or bear weight on the right lower extremity. She also complained of right wrist pain and had an obvious deformity to the right wrist. Prior to the fall she was ambulating with a walker with assistance and had been recovering from ORIF of the left hip performed in November 2013. She denied any loss of consciousness or any other injury.   Past Med/Surgical Hx:  Dementia:   TIA - Transient Ischemic Attack:   Arthritis:   Diabetes Mellitus, Type II (NIDD):   ORIF of left pertrochanteric femur fracture:   Cholecystectomy:   Appendectomy:   ALLERGIES:  No Known Allergies:   HOME MEDICATIONS: Medication Instructions Status  glipiZIDE 5 mg oral tablet 0.5 tab(s) orally once a day at supper Active  Levemir FlexPen 100 units/mL subcutaneous solution 7 unit(s) subcutaneous once a day (at bedtime) Active  Vitamin B-12 1 tab(s) orally once a day Active  aspirin 81 mg oral tablet, chewable 1 tab(s) orally once a day Active  Actos 30 mg oral tablet 1 tab(s) orally once a day Active  metoprolol tartrate 25 mg oral tablet 0.5 tab(s) orally 2 times a day Active  Norco 5 mg-325 mg oral tablet 1 tab(s) orally every 4 hours, As Needed- for Pain  Active   Family and Social History:   Social History negative tobacco, negative ETOH, negative Illicit drugs    Place of Living Home   Review of Systems:   ROS Pt not able to provide ROS   Physical Exam:   GEN well developed, well nourished, no acute distress    HEENT PERRL, hearing intact to voice, moist oral mucosa    NECK supple    RESP normal resp effort  clear BS    CARD regular rate  no murmur  no thrills  No LE edema  no JVD    ABD denies tenderness  soft  normal BS    GU foley catheter in place    EXTR Right lower extremity is shortened and externally rotated. Pain is  elicited with attempts at range of motion of the hip. No gross tenderness to palpation of the knee. No gross knee effusion.  Right wrist demonstrates obvious deformity, early  ecchymosis, and swelling. Tender to palpation along the distal radius.    NEURO motor/sensory function intact    PSYCH alert, poor insight   Lab Results: Hepatic:  08-Jan-14 10:35    Bilirubin, Total 0.3   Alkaline Phosphatase  168   SGPT (ALT) 21   SGOT (AST)  51   Total Protein, Serum 6.9   Albumin, Serum  2.7  Routine BB:  08-Jan-14 11:21    ABO Group + Rh Type A Positive   Antibody Screen NEGATIVE (Result(s) reported on 15 Jul 2012 at 01:03PM.)  Routine Chem:  08-Jan-14 10:35    Glucose, Serum  172   BUN 13   Sodium, Serum 138   Potassium, Serum 4.1   Chloride, Serum 103   CO2, Serum 32   Calcium (Total), Serum 8.6   Osmolality (calc) 280   eGFR (African American) >60   eGFR (Non-African American) >60 (eGFR values <75m/min/1.73 m2 may be an indication of chronic kidney disease (CKD). Calculated eGFR is useful in patients with stable renal function. The eGFR calculation will not be reliable in acutely ill patients when serum creatinine  is changing rapidly. It is not useful in  patients on dialysis. The eGFR calculation may not be applicable to patients at the low and high extremes of body sizes, pregnant women, and vegetarians.)   Anion Gap  3  Routine UA:  08-Jan-14 11:33    Color (UA) Yellow   Clarity (UA) Cloudy   Glucose (UA) Negative   Bilirubin (UA) Negative   Ketones (UA) Negative   Specific Gravity (UA) 1.013   Blood (UA) Negative   pH (UA) 9.0   Protein (UA) Negative   Nitrite (UA) Negative   Leukocyte Esterase (UA) Negative (Result(s) reported on 15 Jul 2012 at 12:05PM.)   RBC (UA) 2 /HPF   WBC (UA) 3 /HPF   Bacteria (UA) NONE SEEN   Epithelial Cells (UA) NONE SEEN   Amorphous Crystal (UA) PRESENT (Result(s) reported on 15 Jul 2012 at 12:05PM.)  Routine Coag:  08-Jan-14  10:35    Activated PTT (APTT) 28.5 (A HCT value >55% may artifactually increase the APTT. In one study, the increase was an average of 19%. Reference: "Effect on Routine and Special Coagulation Testing Values of Citrate Anticoagulant Adjustment in Patients with High HCT Values." American Journal of Clinical Pathology 2006;126:400-405.)   Prothrombin 12.3   INR 0.9 (INR reference interval applies to patients on anticoagulant therapy. A single INR therapeutic range for coumarins is not optimal for all indications; however, the suggested range for most indications is 2.0 - 3.0. Exceptions to the INR Reference Range may include: Prosthetic heart valves, acute myocardial infarction, prevention of myocardial infarction, and combinations of aspirin and anticoagulant. The need for a higher or lower target INR must be assessed individually. Reference: The Pharmacology and Management of the Vitamin K  antagonists: the seventh ACCP Conference on Antithrombotic and Thrombolytic Therapy. RJJOA.4166 Sept:126 (3suppl): [id number]. A HCT value >55% may artifactually increase the PT.  In one study,  the increase was an average of 25%. Reference:  "Effect on Routine and Special Coagulation Testing Values of Citrate Anticoagulant Adjustment in Patients with High HCT Values." American Journal of Clinical Pathology 2006;126:400-405.)  Routine Hem:  08-Jan-14 10:35    WBC (CBC) 7.9   RBC (CBC) 3.83   Hemoglobin (CBC) 12.2   Hematocrit (CBC) 37.5   Platelet Count (CBC) 273   MCV 98   MCH 31.8   MCHC 32.4   RDW 14.3   Neutrophil % 67.0   Lymphocyte % 24.0   Monocyte % 8.4   Eosinophil % 0.1   Basophil % 0.5   Neutrophil # 5.3   Lymphocyte # 1.9   Monocyte # 0.7   Eosinophil # 0.0   Basophil # 0.0 (Result(s) reported on 15 Jul 2012 at 10:48AM.)     Assessment/Admission Diagnosis Right pertrochanteric femur fracture  Comminuted right distal radius fracture   Secondary  diagnoses: Diabetes Dementia History of TIA    Plan Recommended ORIF of right pertrochanteric femur fracture and right distal radius fracture   The risks and benefits of surgical intervention were discussed in detail with the patient and her family. They expressed understanding of the risks and benefits and agreed with plans for surgery. The family has requested Dr. Rudene Christians for the surgery. He spoke to the patient and her family and plans to proceed with plans for surgery tomorrow.  Surgical site signed by Dr. Rudene Christians as per "right site surgery" protocol.   The right wrist was splinted with a well-padded short arm splint.   Electronic Signatures: Dereck Leep (MD)  (  Signed 08-Jan-14 18:44)  Authored: CHIEF COMPLAINT and HISTORY, PAST MEDICAL/SURGIAL HISTORY, ALLERGIES, HOME MEDICATIONS, FAMILY AND SOCIAL HISTORY, REVIEW OF SYSTEMS, PHYSICAL EXAM, LABS, ASSESSMENT AND PLAN   Last Updated: 08-Jan-14 18:44 by Dereck Leep (MD)

## 2014-10-28 NOTE — Consult Note (Signed)
PATIENT NAME:  Sharon Barker, Sharon Barker MR#:  [id number] DATE OF BIRTH:  June 22, 1928  DATE OF CONSULTATION:  07/15/2012  PRIMARY CARE PHYSICIAN:  Dr. Vonita Moss   REFERRING PHYSICIAN:   Dr. Bobbye Morton PHYSICIAN:  Shaune Pollack, MD   REASON FOR CONSULT:  Medical management.  REVIEW OF HISTORY: The patient is an 81 year old Caucasian female with a history of hypertension, diabetes, history of left hip fracture last year, questionable TIA, severe degenerative joint disease with the right knee presented with fall by accident today, got injury on the right wrist and hip, was diagnosed with fracture.  Dr. Ernest Pine requests medical consult.  The patient is awake, alert, oriented. She denies any symptoms except right wrist and right hip pain. According to patient's son, the patient had a left hip fracture last year, got surgery, then she used a walker. The patient is living with her son and she fell by accident today, but she has no syncope, loss of consciousness or seizure. No incontinence. The patient denies any fever or chills. No headache or dizziness or chest pain, palpitation, orthopnea or nocturnal dyspnea. No leg edema. The patient denies any abdominal pain, nausea, vomiting or diarrhea. Has a good appetite. No dysuria, hematuria or incontinence. No easy bruising or bleeding.   PAST MEDICAL HISTORY: Hypertension, diabetes, left hip fracture, questionable TIA and severe degenerative joint disease with right knee.   PAST SURGICAL HISTORY:  Cholecystectomy, appendectomy and left hip surgery.   SOCIAL HISTORY:  No smoking or drinking or illicit drugs.   FAMILY HISTORY: Hypertension.  REVIEW OF SYSTEMS:  As mentioned above, otherwise negative.   HOME MEDICATIONS: 1.  Vitamin B12 one tablet once a day. 2.  Norco 5 mg/325 mg p.o. q. 4 hours p.r.n. 3.  Lopressor 25 mg  0.5 tab p.o. b.i.d. 4.  Levemir 7 units subcutaneous once a day. 5.  Aspirin 81 mg p.o. daily. 6.  Actos 30 mg p.o. daily.   PHYSICAL  EXAMINATION: VITAL SIGNS: Temperature 97.9, blood pressure 122/69, pulse 83, respirations 18, O2 saturation 98% on room air.  GENERAL:  The patient is alert, awake, oriented, in no acute distress.  HEENT:  Pupils are round, equal, reactive to light and accommodation. Moist oral mucosa. Clear oropharynx.  NECK:  Supple. No JVD or carotid bruit. No lymphadenopathy. No thyromegaly.  CARDIOVASCULAR:  S1 and S2, regular rate and rhythm. No murmurs or gallops.  PULMONARY:  Bilateral air entry. No wheezing or rales. No use of accessory muscles to breathe.  ABDOMEN:  Soft. No distention. No tenderness. No organomegaly. Bowel sounds present.  EXTREMITIES: No edema, clubbing or cyanosis. No calf tenderness. Has a right wrist deformity and right lower extremity deformity.  NEUROLOGIC:  AO x 3. No focal deficits. Sensation intact. Unable to check patient's power due to fracture and the injury.  LABORATORY DATA:  Shows urinalysis is negative.  Right wrist x-ray shows occult scaphoid fracture, snuffbox tenderness or ligamentous injury. X-rays show right hip fracture.  Chest x-ray:  No acute disease of the chest.  CBC is normal. Blood glucose 172, BUN 13, creatinine 0.5. Electrolytes are normal. INR 0.9. EKG showed normal sinus rhythm at 71 BPM  IMPRESSION: 1.  Impacted distal right radial fracture.  2.  Right hip fracture.  3.  Hypertension, controlled.  4.  Diabetes, controlled.   RECOMMENDATIONS: 1.  The patient has a moderate risk for orthopedic surgery. We will hold aspirin and continue Lopressor for diabetes. We will hold Actos, glipizide and Levemir and start  sliding scale. 2.  The patient may need DVT prophylaxis after surgery. Discussed the patient's situation and recommendations with the patient and the patient's son.   TIME SPENT:  About 55 minutes      ____________________________ Shaune PollackQing Quida Glasser, MD qc:ce D: 07/15/2012 16:11:07 ET T: 07/15/2012 16:37:24 ET JOB#: [id number]  cc: Shaune PollackQing Freddi Forster,  MD, <Dictator> Shaune PollackQING Keyshawn Hellwig MD ELECTRONICALLY SIGNED 07/16/2012 15:43

## 2014-10-28 NOTE — Op Note (Signed)
13PATIENT NAME:  Sharon Barker, Sharon Barker MR#:  [id number] DATE OF BIRTH:  Jan 29, 1928  DATE OF PROCEDURE:  07/16/2012  PREOPERATIVE DIAGNOSES: 1.  Right trochanteric hip fracture. 2.  Right Colles' fracture.   POSTOPERATIVE DIAGNOSES: 1.  Right trochanteric hip fracture. 2.  Right Colles' fracture.  PROCEDURE PERFORMED: Open reduction/internal fixation, right wrist.   SURGEON: Sharon SchullerMichael J. Renly Barker, M.D.   ANESTHESIA: General.   TOURNIQUET TIME:  33 minutes at 250 mmHg.  ESTIMATED BLOOD LOSS:  160 mL.  IMPLANTS:  Right long 360 x 9 mm 130 degree Affixus rod with 110 mm lag screw, 90 mm anti rotation screw, standard length DVR narrow plate with multiple pegs and screws.  DESCRIPTION OF PROCEDURE: The patient was brought to the Operating Room and after adequate general anesthesia was obtained, the patient was transferred to the fracture table.  The left leg was placed in the well leg-holder.  The right foot was placed in the traction boot and traction applied, with manipulation to try to get better alignment. After acceptable alignment was obtained by closed means, the hip was prepped and draped using the barrier drape method. After appropriate patient identification and timeout procedure was completed, a proximal incision just proximal to the greater trochanter was made and the guide  wire inserted acrosss the fracture. Reaming was carried out in the proximal canal and a guidewire inserted down the canal.  Measurements were made off of this guide wire and an 11 mm reamer was used.  A 360 mm rod was obtained and inserted down the canal, with acceptable alignment obtained. .  A small lateral incision was made for lag screw insertion with guide wire placed in a near center/center position, measurements made and drilling completed.  The lag screw was then inserted and a proximal setscrew was tightened and slightly loosened to allow for compression at the fracture site.  An additional anti rotation screw was then  placed throught the targeting guide, lag screw 110 mm, lag screw of 90 mm inserted, drilling and then placing the screw.  The insertion handle was then removed and AP and lateral images showed good position of the proximal fixation in AP and lateral imaging.  Distal screws were placed with the free hand technique , drilling and placing 42 and 38 mm 5.0 mm screws placed.  The wounds were thoroughly irrigated The wounds were closed with 1-0 Vicryl deep, 2-0 Vicryl sucuatneously, and skin staples.  Xeroform, 4x4's and ABD dressing applied.  The right foot was then taken out of the traction boot and the arm table was brought in and the right arm was then prepped and draped in the usual fashion with a tourniquet applied to the upper arm.  Appropriate time out procedure completed and the tourniquet raised.  Dkin incision  was made over the distal radius over the FCR tendon, the tendon retracted radially and the pronator elevated off the volar distal radius.  With manual traction, near anatomic length was  restored and with some dorsal pressure, some volar tilt was also regained.  A standard length narrow DVR plate was then applied and the four proximal cortical screws inserted.  Next, with the fracture held in a reduced position, a guidewire was inserted to maintain alignment and then the screw holes were filled using variable angle and smooth pegs.  Care was taken under fluoroscopy to make certain there was not pin or screw penetration into the joint.  Traction was released and with passive range of motion, there did not  appear to be any loss of fixation or again penetration into the joint.  The tourniquet was let down at this point and hemostasis was checked with electrocautery and the wound was irrigated and closed with 2-0 Vicryl subcutaneously, 4-0 nylon for the skin.  Xeroform, 4 x 4s, Webril and splint was applied, as well as an Ace wrap.  The patient was sent to the recovery room in stable condition.      ____________________________ Sharon Schuller, MD mjm:eg D: 07/16/2012 17:31:00 ET T: 07/16/2012 20:45:13 ET JOB#: [id number]  cc: Sharon Schuller, MD, <Dictator> Sharon Schuller MD ELECTRONICALLY SIGNED 07/19/2012 13:12

## 2016-01-06 DEATH — deceased
